# Patient Record
Sex: Female | Born: 1975 | Hispanic: Yes
Health system: Southern US, Community
[De-identification: ages and names within clinical notes are randomized; demographics above are authoritative.]

## PROBLEM LIST (undated history)

## (undated) HISTORY — PX: OTHER SURGICAL HISTORY: SHX169

---

## 2012-03-10 DIAGNOSIS — Z87410 Personal history of cervical dysplasia: Secondary | ICD-10-CM | POA: Insufficient documentation

## 2012-03-10 HISTORY — PX: OTHER SURGICAL HISTORY: SHX169

## 2012-10-06 ENCOUNTER — Emergency Department: Payer: Self-pay | Admitting: Emergency Medicine

## 2012-10-06 LAB — URINALYSIS, COMPLETE
Bilirubin,UR: NEGATIVE
Blood: NEGATIVE
Glucose,UR: NEGATIVE mg/dL (ref 0–75)
Ketone: NEGATIVE
Nitrite: NEGATIVE
Ph: 5 (ref 4.5–8.0)
WBC UR: 2 /HPF (ref 0–5)

## 2012-10-06 LAB — CBC
HCT: 37 % (ref 35.0–47.0)
MCH: 33.3 pg (ref 26.0–34.0)
MCHC: 35.2 g/dL (ref 32.0–36.0)
Platelet: 296 10*3/uL (ref 150–440)
WBC: 10.9 10*3/uL (ref 3.6–11.0)

## 2012-10-06 LAB — HCG, QUANTITATIVE, PREGNANCY: Beta Hcg, Quant.: 59322 m[IU]/mL — ABNORMAL HIGH

## 2012-12-28 ENCOUNTER — Observation Stay: Payer: Self-pay | Admitting: Obstetrics and Gynecology

## 2012-12-28 LAB — COMPREHENSIVE METABOLIC PANEL
Alkaline Phosphatase: 101 U/L (ref 50–136)
Anion Gap: 7 (ref 7–16)
Calcium, Total: 8.3 mg/dL — ABNORMAL LOW (ref 8.5–10.1)
Chloride: 107 mmol/L (ref 98–107)
Co2: 23 mmol/L (ref 21–32)
Creatinine: 0.36 mg/dL — ABNORMAL LOW (ref 0.60–1.30)
EGFR (African American): >60
Glucose: 109 mg/dL — ABNORMAL HIGH (ref 65–99)
Osmolality: 272 (ref 275–301)
Potassium: 3.4 mmol/L — ABNORMAL LOW (ref 3.5–5.1)
Sodium: 137 mmol/L (ref 136–145)
Total Protein: 6.2 g/dL — ABNORMAL LOW (ref 6.4–8.2)

## 2012-12-28 LAB — URINALYSIS, COMPLETE
Bacteria: NONE SEEN
Bilirubin,UR: NEGATIVE
Blood: NEGATIVE
Glucose,UR: NEGATIVE mg/dL (ref 0–75)
Nitrite: NEGATIVE
Ph: 7 (ref 4.5–8.0)
Protein: NEGATIVE
Specific Gravity: 1.008 (ref 1.003–1.030)
Squamous Epithelial: 4
WBC UR: 1 /HPF (ref 0–5)

## 2012-12-28 LAB — CBC WITH DIFFERENTIAL/PLATELET
Basophil %: 0.4 %
Eosinophil %: 1.7 %
HCT: 32.4 % — ABNORMAL LOW (ref 35.0–47.0)
HGB: 11.3 g/dL — ABNORMAL LOW (ref 12.0–16.0)
Lymphocyte #: 1.6 10*3/uL (ref 1.0–3.6)
Lymphocyte %: 13.4 %
MCH: 33.2 pg (ref 26.0–34.0)
MCHC: 34.9 g/dL (ref 32.0–36.0)
Monocyte #: 0.9 x10 3/mm (ref 0.2–0.9)
Neutrophil %: 76.9 %
WBC: 12.1 10*3/uL — ABNORMAL HIGH (ref 3.6–11.0)

## 2012-12-30 ENCOUNTER — Ambulatory Visit: Payer: Self-pay | Admitting: Obstetrics and Gynecology

## 2013-02-15 ENCOUNTER — Observation Stay: Payer: Self-pay | Admitting: Obstetrics and Gynecology

## 2013-03-11 ENCOUNTER — Observation Stay: Payer: Self-pay

## 2013-03-30 ENCOUNTER — Inpatient Hospital Stay: Payer: Self-pay | Admitting: Obstetrics and Gynecology

## 2013-03-30 LAB — CBC WITH DIFFERENTIAL/PLATELET
Basophil #: 0 10*3/uL (ref 0.0–0.1)
Basophil %: 0.4 %
EOS ABS: 0.1 10*3/uL (ref 0.0–0.7)
Eosinophil %: 1.4 %
HCT: 35.2 % (ref 35.0–47.0)
HGB: 12.3 g/dL (ref 12.0–16.0)
Lymphocyte #: 1.6 10*3/uL (ref 1.0–3.6)
Lymphocyte %: 15.8 %
MCH: 33.3 pg (ref 26.0–34.0)
MCHC: 34.9 g/dL (ref 32.0–36.0)
MCV: 95 fL (ref 80–100)
Monocyte #: 0.7 x10 3/mm (ref 0.2–0.9)
Monocyte %: 7.1 %
NEUTROS PCT: 75.3 %
Neutrophil #: 7.5 10*3/uL — ABNORMAL HIGH (ref 1.4–6.5)
Platelet: 280 10*3/uL (ref 150–440)
RBC: 3.7 10*6/uL — ABNORMAL LOW (ref 3.80–5.20)
RDW: 13.6 % (ref 11.5–14.5)
WBC: 9.9 10*3/uL (ref 3.6–11.0)

## 2013-03-31 LAB — HEMATOCRIT: HCT: 33.9 % — ABNORMAL LOW (ref 35.0–47.0)

## 2013-04-01 LAB — PATHOLOGY REPORT

## 2013-04-01 LAB — HEMOGLOBIN: HGB: 9.8 g/dL — ABNORMAL LOW (ref 12.0–16.0)

## 2014-06-17 NOTE — Op Note (Signed)
PATIENT NAME:  Kara Carroll, Kara Carroll MR#:  [id number] DATE OF BIRTH:  1975/03/16  DATE OF PROCEDURE:  03/31/2013  PREOPERATIVE DIAGNOSES:  1. Multipara. 2. Postpartum day 2. 3. Desires permanent sterilization. 4. Declines less permanent means.   POSTOPERATIVE DIAGNOSES:  1. Multipara. 2. Postpartum day 2. 3. Desires permanent sterilization. 4. Declines less permanent means.   PROCEDURE: Postpartum bilateral tubal ligation, modified [place].   SURGEON: Ricky L. Amalia Hailey, MD  ANESTHESIA: General endotracheal by Dr. Marcello Moores.   FINDINGS: Moderately obese postpartum abdomen, grossly normal uterine fundus and fallopian tubes.   ESTIMATED BLOOD LOSS: Minimal.   COMPLICATIONS: None.   SPECIMENS: Portion of bilateral fallopian tubes.   PROCEDURE IN DETAIL: The patient was consented and taken to the operating room and placed in supine position, where anesthesia was initiated and prepped and draped in the usual sterile fashion. Infraumbilical area was visualized and grasped at 9 and 3 o'clock with Allis clamps and injected with approximately 10 mL of 0.5% Sensorcaine. A #15 blade was used to make a transverse approximately 3 cm incision which was carried bluntly and sharply down to the peritoneal cavity. With the aid of Omni retractors and tilting the patient to the left, I was able to visualize the right fallopian tube. It was grasped and traced to fimbriated end to ensure identification and regrasped in a relatively avascular region of the ampullary portion and double ligated with 0 plain. A knuckle of tube was excised and sent for permanent pathology. Stumps were cauterized.  I proceeded in a similar fashion on the left side, rotating the patient to right.   Areas were inspected for hemostasis and seemed to be excellent. The fascia and deep tissue were closed with interrupted vertical mattress sutures of 0 Vicryl. Subcutaneous closure with 4-0 Vicryl. Steri-Strips and Band-Aids were  applied.   The patient tolerated the procedure well. All instrument, needle and sponge counts were correct. I anticipate a routine postoperative course.   ____________________________ Rockey Situ. Amalia Hailey, MD rle:lb D: 03/31/2013 11:03:55 ET T: 03/31/2013 11:31:50 ET JOB#: [id number]  cc: Ricky L. Amalia Hailey, MD, <Dictator> Selmer Dominion MD ELECTRONICALLY SIGNED 03/31/2013 12:29

## 2014-07-04 NOTE — H&P (Signed)
L&D Evaluation:  History:  HPI 39yo  [id number] with LMP of 04/24/12 and EDD of 04/02/13 here for IOL due to rapid labor pattern with [facility] significant for AMA, Late PNC at 19 weeks, LGSIL +HPV with plans for pp colposcopy, work up for SOB/Dyspnea, Neg ECHO and neg VQ scan, pt plans BTL. Dr. Cain Sieve aware of pt admission and agrees with AROM and Pitocin if needed. Pt aware IOL with Pitocin may causes maternal or fetal complications including tachysystole of uterus, fetal HR changes necessitating O2, repositioning, fluid boluses, stopping Pitocin. IOL has higher incidence of LTCS and pt accepts these risks.   Presents with IOL   Patient's Medical History AMA, LGSIL   Patient's Surgical History wisdom teeth   Medications Pre Natal Vitamins   Allergies NKDA   Social History none  married to [place]   Family History Non-Contributory   ROS:  ROS All systems were reviewed.  HEENT, CNS, GI, GU, Respiratory, CV, Renal and Musculoskeletal systems were found to be normal.   Exam:  Vital Signs stable   General no apparent distress   Mental Status clear   Chest clear   Heart normal sinus rhythm, no murmur/gallop/rubs   Abdomen gravid, non-tender   Estimated Fetal Weight Average for gestational age   Fetal Position vtx   Back no CVAT   Edema 1+   Pelvic 3/70/vtx-2   Mebranes Ruptured, ARM for clear fluid at [phone]   Description clear   FHT normal rate with no decels, reactive NSt with 2 accels 15 x 15 bpm   Ucx irregular   Skin dry   Lymph no lymphadenopathy   Impression:  Impression IUP at 39 4/7 weeks   Plan:  Plan monitor contractions and for cervical change   Electronic Signatures: Catheryn Bacon (CNM)  (Signed 04-Feb-15 09:28)  Authored: L&D Evaluation   Last Updated: 04-Feb-15 09:28 by Catheryn Bacon (CNM)

## 2014-11-26 IMAGING — CR DG CHEST 2V
1 series · 2 of 2 positions shown · non-contrast
Comparison: None.

CLINICAL DATA: Shortness of breath

EXAM:
CHEST  2 VIEW

[Series 1: pa · 0.17mm/px · 2 of 2 slices shown]
[im 1/2]
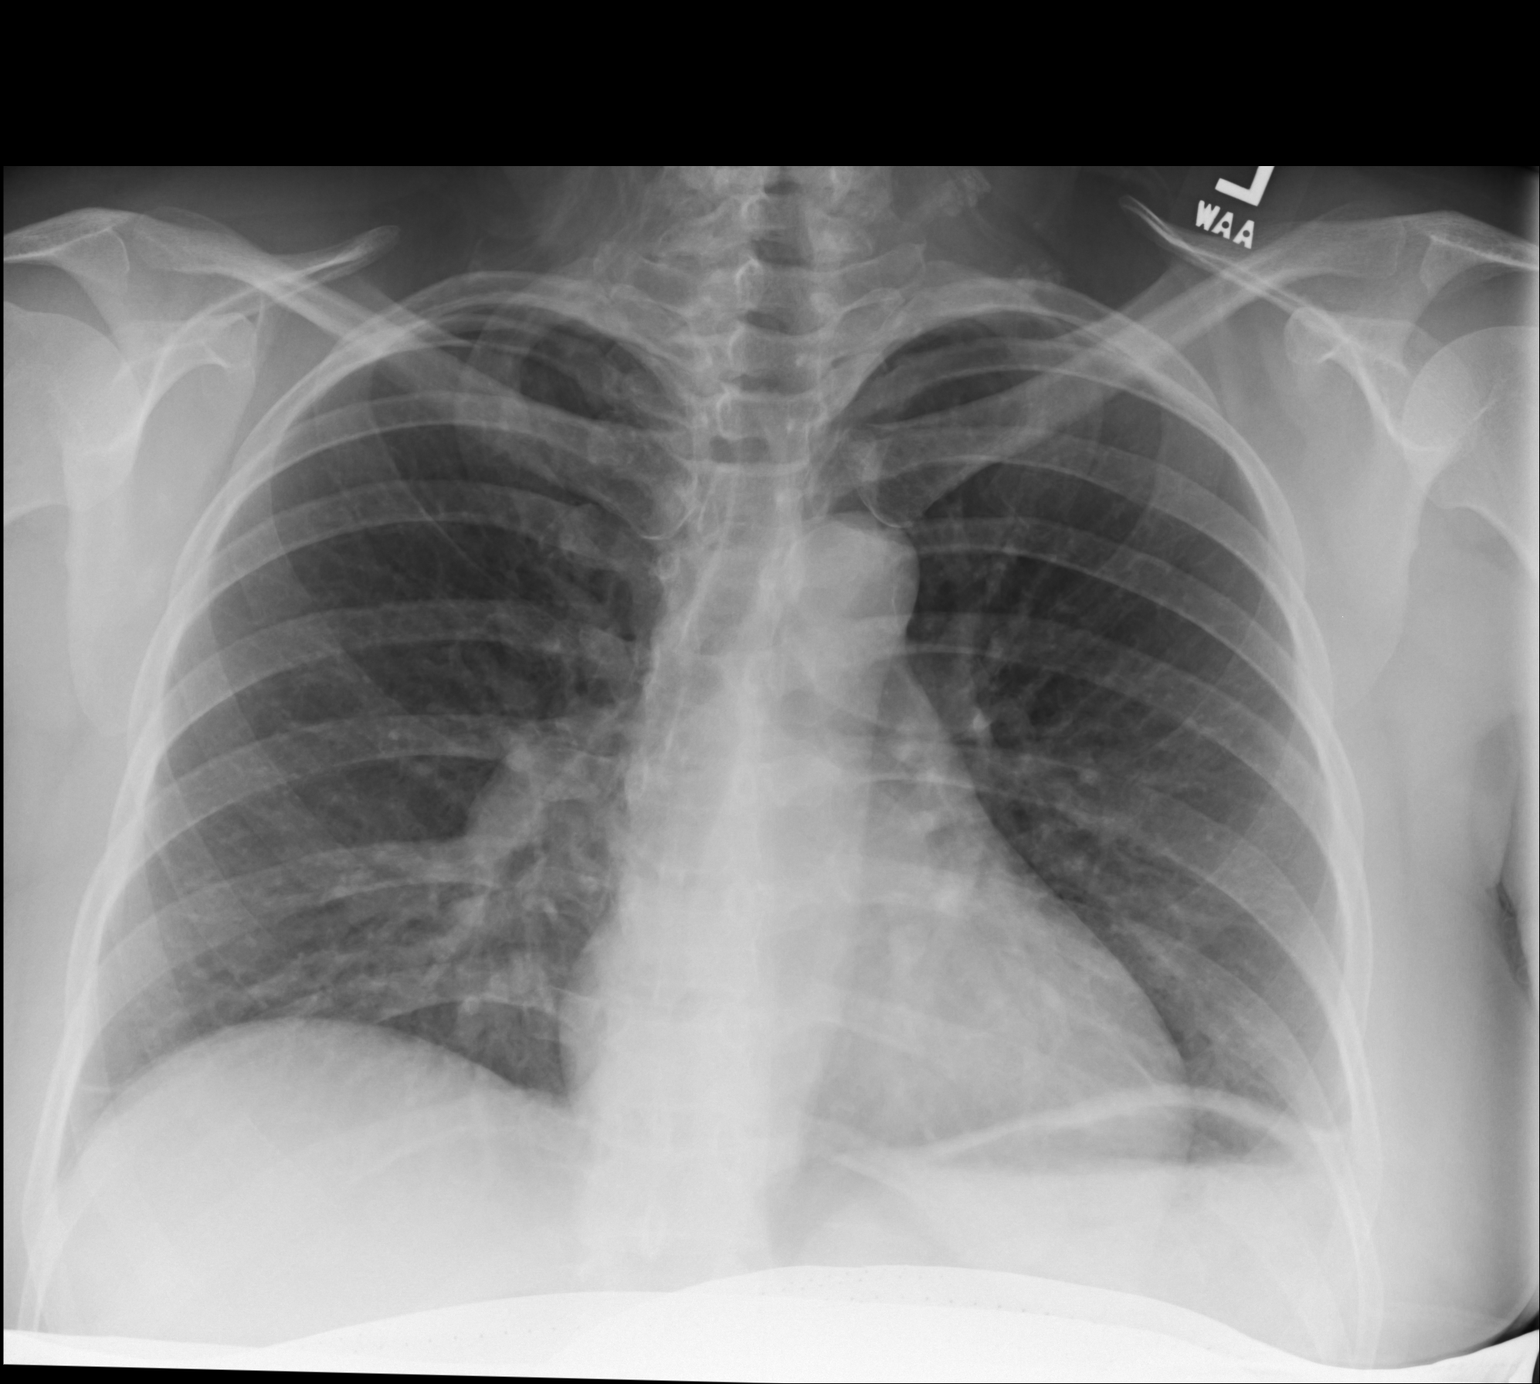
[im 2/2]
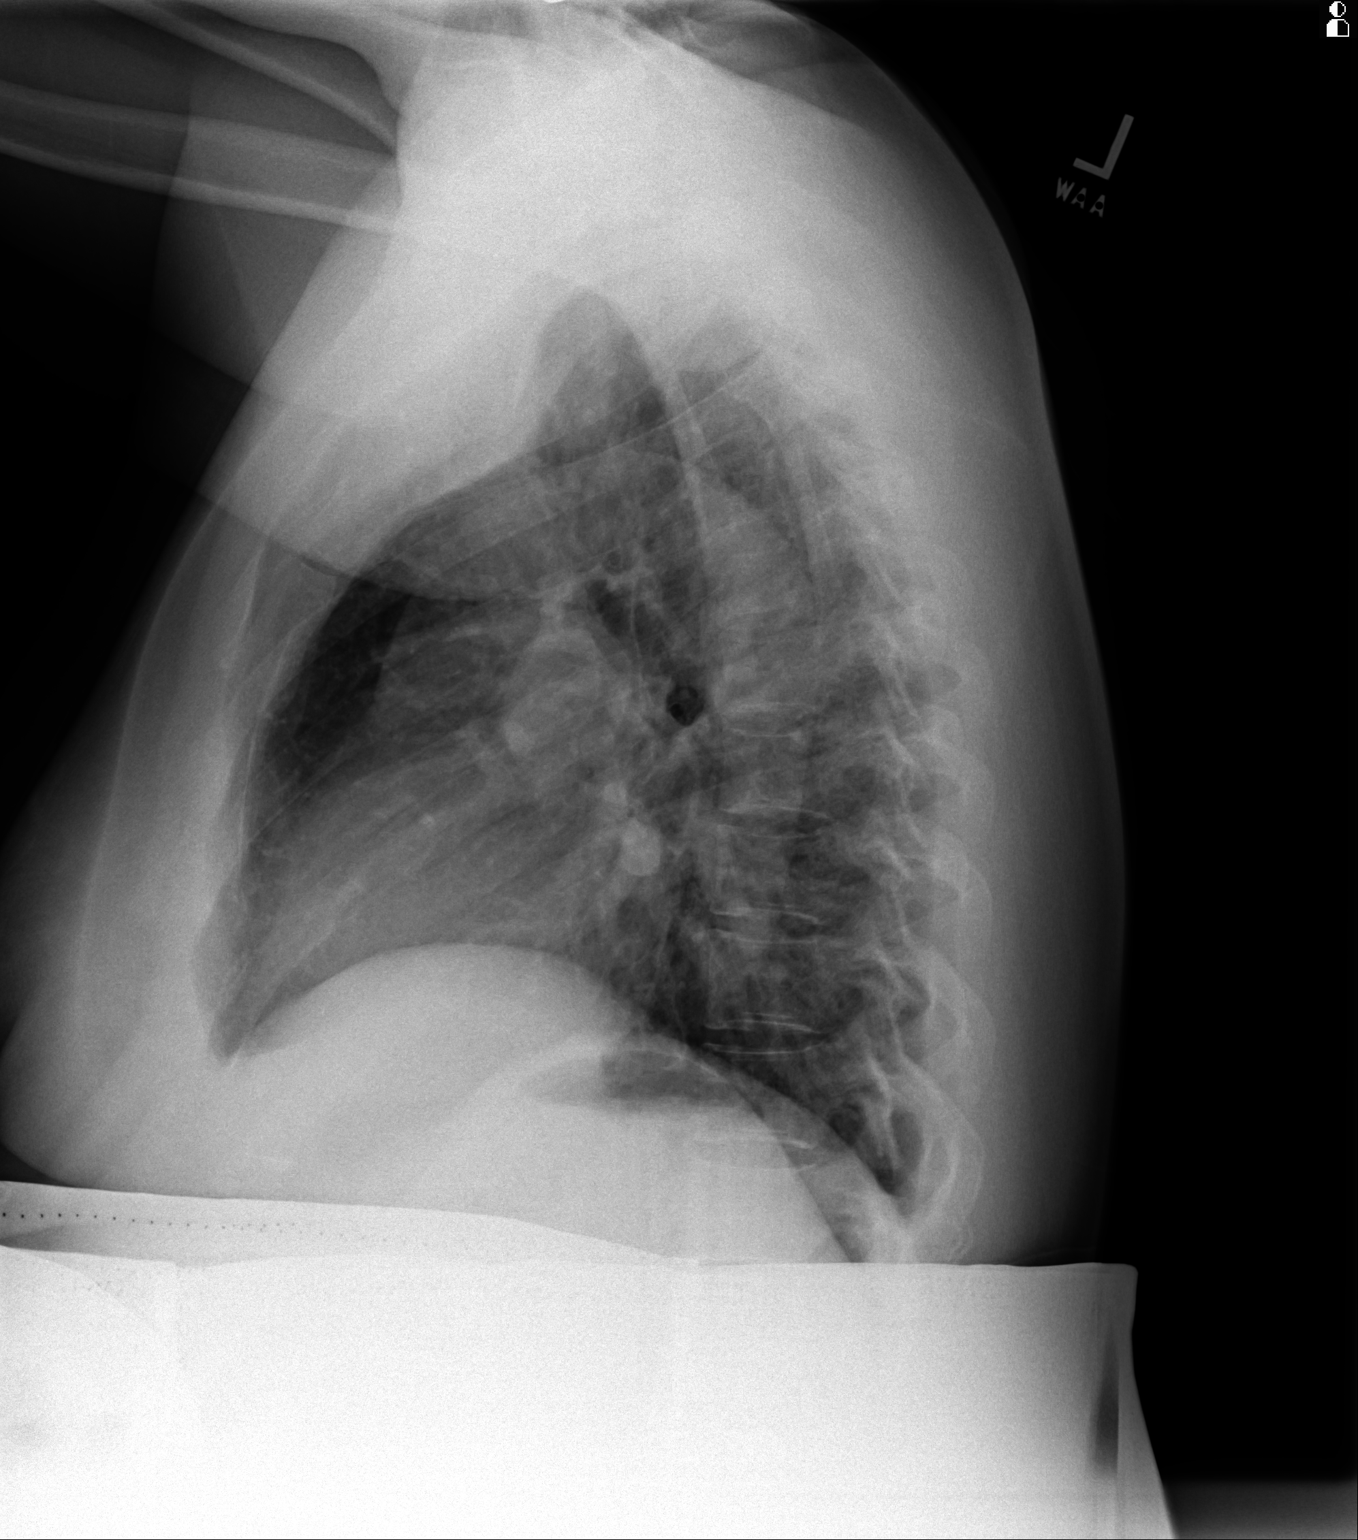

[2 of 2 positions shown; findings below may reference images not displayed]

FINDINGS: The cardiac shadow is within normal limits. The lungs are clear
bilaterally. No acute bony abnormality is noted. Patient is double
shielded due to the current pregnant state.
IMPRESSION: No active cardiopulmonary disease.

## 2016-02-04 ENCOUNTER — Other Ambulatory Visit: Payer: Self-pay | Admitting: Otolaryngology

## 2016-02-04 DIAGNOSIS — R42 Dizziness and giddiness: Secondary | ICD-10-CM

## 2016-02-15 ENCOUNTER — Encounter: Payer: Self-pay | Admitting: Radiology

## 2016-02-15 ENCOUNTER — Ambulatory Visit
Admission: RE | Admit: 2016-02-15 | Discharge: 2016-02-15 | Disposition: A | Discharge status: Home / Self Care | Source: Ambulatory Visit | Attending: Otolaryngology | Admitting: Otolaryngology

## 2016-02-15 DIAGNOSIS — R42 Dizziness and giddiness: Secondary | ICD-10-CM | POA: Insufficient documentation

## 2016-02-15 DIAGNOSIS — R9082 White matter disease, unspecified: Secondary | ICD-10-CM | POA: Insufficient documentation

## 2016-02-15 DIAGNOSIS — R9089 Other abnormal findings on diagnostic imaging of central nervous system: Secondary | ICD-10-CM | POA: Diagnosis not present

## 2016-02-15 MED ORDER — GADOBENATE DIMEGLUMINE 529 MG/ML IV SOLN
15.0000 mL | Freq: Once | INTRAVENOUS | Status: AC | PRN
  Administered 2016-02-15: 12 mL via INTRAVENOUS

## 2016-04-08 DIAGNOSIS — G43719 Chronic migraine without aura, intractable, without status migrainosus: Secondary | ICD-10-CM | POA: Insufficient documentation

## 2017-05-20 ENCOUNTER — Ambulatory Visit: Payer: Self-pay

## 2017-06-30 ENCOUNTER — Ambulatory Visit: Payer: Self-pay

## 2017-06-30 DIAGNOSIS — Z0000 Encounter for general adult medical examination without abnormal findings: Secondary | ICD-10-CM

## 2017-07-28 DIAGNOSIS — N3281 Overactive bladder: Secondary | ICD-10-CM | POA: Insufficient documentation

## 2017-07-28 DIAGNOSIS — G930 Cerebral cysts: Secondary | ICD-10-CM | POA: Insufficient documentation

## 2017-12-31 ENCOUNTER — Ambulatory Visit: Payer: Self-pay

## 2018-01-07 ENCOUNTER — Ambulatory Visit: Payer: Self-pay

## 2018-01-07 DIAGNOSIS — Z789 Other specified health status: Secondary | ICD-10-CM

## 2021-06-21 ENCOUNTER — Encounter: Payer: Self-pay | Admitting: Radiology

## 2021-08-06 ENCOUNTER — Encounter: Payer: Self-pay | Admitting: Radiology

## 2021-09-20 ENCOUNTER — Encounter: Payer: Medicaid Other | Admitting: Nurse Practitioner

## 2021-11-07 DIAGNOSIS — D219 Benign neoplasm of connective and other soft tissue, unspecified: Secondary | ICD-10-CM | POA: Insufficient documentation

## 2021-11-07 DIAGNOSIS — N3090 Cystitis, unspecified without hematuria: Secondary | ICD-10-CM | POA: Insufficient documentation

## 2022-02-03 ENCOUNTER — Ambulatory Visit: Attending: Physician Assistant | Admitting: Physical Therapy

## 2022-02-03 ENCOUNTER — Encounter: Payer: Self-pay | Admitting: Physical Therapy

## 2022-02-03 DIAGNOSIS — N3281 Overactive bladder: Secondary | ICD-10-CM | POA: Insufficient documentation

## 2022-02-03 DIAGNOSIS — Z87410 Personal history of cervical dysplasia: Secondary | ICD-10-CM | POA: Diagnosis present

## 2022-02-03 DIAGNOSIS — M533 Sacrococcygeal disorders, not elsewhere classified: Secondary | ICD-10-CM | POA: Diagnosis present

## 2022-02-03 DIAGNOSIS — R2689 Other abnormalities of gait and mobility: Secondary | ICD-10-CM | POA: Diagnosis present

## 2022-02-03 DIAGNOSIS — G930 Cerebral cysts: Secondary | ICD-10-CM | POA: Diagnosis present

## 2022-02-03 DIAGNOSIS — G43719 Chronic migraine without aura, intractable, without status migrainosus: Secondary | ICD-10-CM | POA: Diagnosis present

## 2022-02-03 DIAGNOSIS — N3090 Cystitis, unspecified without hematuria: Secondary | ICD-10-CM | POA: Diagnosis present

## 2022-02-03 DIAGNOSIS — D219 Benign neoplasm of connective and other soft tissue, unspecified: Secondary | ICD-10-CM | POA: Insufficient documentation

## 2022-02-03 NOTE — Patient Instructions (Addendum)
  No more sit ups  ___  Balance bladder irritants to water   Currently: 32 fl oz water, no juice, 8 fl oz of coffee, 32 fl oz sodas,   Increase: 40 fl oz water, decrease sodas to 16 fl oz, still enjoy 8 fl oz.  ( Handout - bladder irritant)   ___    Avoid straining pelvic floor, abdominal muscles , spine  Use log rolling technique instead of getting out of bed with your neck or the sit-up     Log rolling into and out of bed   Log rolling into and out of bed If getting out of bed on R side, Bent knees, scoot hips/ shoulder to L  Raise R arm completely overhead, rolling onto armpit  Then lower bent knees to bed to get into complete side lying position  Then drop legs off bed, and push up onto R elbow/forearm, and use L hand to push onto the bed    Dig elbows and feet to lift hte buttocks and scoot without lifting head   __   Sitting with feet on floor, hip width apart , not crossed   __   Lengthen Back rib by L  shoulder ( winging)    Lie on R  side , pillow between knees and under head  Pull  L arm overhead over mattress, grab the edge of mattress,pull it upward, drawing elbow away from ears  Breathing 10 reps  Brushing arm with 3/4 turn onto pillow behind back  Lying on R  side ,Pillow/ Block between knees     dragging top forearm across ribs below breast rotating 3/4 turn,  rotating  _L_ only this week ,  relax onto the pillow behind the back  and then back to other palm , maintain top palm on body whole top and not lift shoulder  ___

## 2022-02-03 NOTE — Therapy (Signed)
OUTPATIENT PHYSICAL THERAPY EVALUATION   Patient Name: Trinia Georgi MRN: [id number] DOB:[birth year], 46 y.o., female Today's Date: 02/03/2022   PT End of Session - 02/03/22 1105     Visit Number 1    Number of Visits 10    Date for PT Re-Evaluation 04/14/22    PT Start Time 1105    PT Stop Time 1145    PT Time Calculation (min) 40 min    Activity Tolerance Patient tolerated treatment well    Behavior During Therapy [facility] for tasks assessed/performed             History reviewed. No pertinent past medical history. Past Surgical History:  Procedure Laterality Date   cervical biopsy with loop electrode excision  03/10/2012   PR upper endoscopy, biopsy     09/21/2020   Patient Active Problem List   Diagnosis Date Noted   Fibroids 11/07/2021   Cystitis 11/07/2021   Arachnoid cyst 07/28/2017   Overactive bladder 07/28/2017   Intractable chronic migraine without aura and without status migrainosus 04/08/2016   History of cervical dysplasia 03/10/2012    PCP:   REFERRING PROVIDER: Burnett Harry , PA   REFERRING DIAG:    G89.29 (ICD-10-CM) - Other chronic pain  R35.0 (ICD-10-CM) - Frequency of micturition  N94.89 (ICD-10-CM) - Other specified conditions associated with female genital organs and menstrual cycle  M62.89 (ICD-10-CM) - Other specified disorders of muscle  N39.46 (ICD-10-CM) - Mixed incontinence  N94.10 (ICD-10-CM) - Unspecified dyspareunia    Rationale for Evaluation and Treatment Rehabilitation  THERAPY DIAG:  Sacrococcygeal disorders, not elsewhere classified  Other abnormalities of gait and mobility  Fibroids  Cystitis  Arachnoid cyst  Overactive bladder  Intractable chronic migraine without aura and without status migrainosus  History of cervical dysplasia  ONSET DATE: 2-3 years   SUBJECTIVE:                                                                                                                                                                                            SUBJECTIVE STATEMENT: 1) pelvic pain by pubic bone: "pressure" 8/10 all positions. Pain occurs at night. Pt push into the area with pillow to relief 4/10. Pt sleeps on her side. Pt also experiences this pressure feeling during intercourse. Pt is still on her menstrual cycle. Pt does 100 sit ups for the past 6 months. Hx of fibroids, 3 vaginal deliveries with excessive pushing. Neg saddle Sx, denied pushing with bowel movements, denied fall onto tailbone. Regular bowel movements without straining.    2) frequent urination : 8 time within 2 hours.  Daily fluids: 32 fl  oz water, no juice, 8 fl oz of coffee, 32 fl oz sodas,   3) LBP at her sacrum , especially with her period 4/10. Non-radiating pain   4) Headaches on R temple:  intensity 5/10 , occurs all the time, sleeps on one pillow.     PERTINENT HISTORY:  See medical section in chart   PAIN:  Are you having pain? Yes: see above   PRECAUTIONS: None  WEIGHT BEARING RESTRICTIONS: No  FALLS:  Has patient fallen in last 6 months? No  LIVING ENVIRONMENT: Lives with: lives with their daughter Lives in: Mobile home Stairs: No   OCCUPATION: no working   PLOF: Independent  PATIENT GOALS:  Stop pain at pubic area, stop going to bathroom to urinate often    OBJECTIVE:    [facility] PT Assessment - 02/03/22 1136       Strength   Overall Strength Comments R hip flex, knee flex/ ext 3+/5, L 4+/5,  hip abd L 4+/5, R 3-/5 ( post Tx: R hip abd 3+/5, R hip flex, knee flex/ext 4+/5      Palpation   Spinal mobility B sideflexion reproduces sacral pain, ( post Tx: no sacral pain with B sideflexion)    SI assessment  R iliac crest higher, R shoulder higher , supine: L iliac crest/ medial malleoli higher, ( post Tx: levelled pelvis/shoulder)    Palpation comment thoracic hypomobility, L medial/ lower mm  Tendnerness pain at pubic symphysis  ( Post Tx: decreased tenderness)        Bed Mobility    Bed Mobility --   crunch method     Ambulation/Gait   Gait Comments less anterior pelvic rotation of pelvic girdle , 1.29 m/s,             Pelvic Floor Special Questions - 02/03/22 1142     Diastasis Recti 1 fingers width             [facility] Adult PT Treatment/Exercise - 02/03/22 1136       Therapeutic Activites    Other Therapeutic Activities explained POC, customized care , goal setting      Neuro Re-ed    Neuro Re-ed Details  cued for log rolling, sitting posture,and stretches      Manual Therapy   Manual therapy comments STM/MWM at thoracic area L to promote levelled shoulders/ pelvis                HOME EXERCISE PROGRAM: See pt instruction section    ASSESSMENT:  CLINICAL IMPRESSION:   Pt is a  46   yo  who presents with pelvic pain, pressure sensation, frequent urination, and non-radiating LBP  which impact QOL, ADL, fitness, and community activities.   Pt's musculoskeletal assessment revealed uneven pelvic girdle and shoulder height, asymmetries to gait pattern, limited spinal /pelvic mobility, dyscoordination and strength of pelvic floor mm, hip weakness, poor body mechanics which places strain on the abdominal/pelvic floor mm. These are deficits that indicate an ineffective intraabdominal pressure system associated with increased risk for pt's Sx.   Pt will benefit from proper coordination training and education on fitness and functional positions in order to yield greater outcomes as pt performed sit-ups/ crunches in the past.  Advised pt to not perform sit-ups and crunches as these movement patterns lead to more downward forces on the pelvic floor, negatively impacting abdominopelvic/spinal dysfunctions.   Pt will benefit from coordination training and education on fitness and functional positions in order to gain a  more effective intraabdominal pressure system to minimize urinary leakage.  Pt was provided education on etiology of Sx with anatomy,  physiology explanation with images along with the benefits of customized pelvic PT Tx based on pt's medical conditions and musculoskeletal deficits.  Explained the physiology of deep core mm coordination and roles of pelvic floor function in urination, defecation, sexual function, and postural control with deep core mm system.   Regional interdependent approaches will yield greater benefits in pt's POC due to the complexity of pt's medical Hx and the significant impact their Sx have had on their QOL. Pt would benefit from a biopsychosocial approach to yield optimal outcomes. Plan to build interdisciplinary team with pt's providers to optimize patient-centered care.    Following Tx today which pt tolerated without complaints, pt demo'd equal alignment of pelvic girdle and increased spinal mobility. Pt reported decreased pelvic pain from 8/10 to 7/10. Pt demo'd no LBP with B sideflexion post Tx. Plan to administer FOTO at next session for pelvic floor and to initiate deep core coordination   next session.    Pt benefits from skilled PT.    OBJECTIVE IMPAIRMENTS decreased activity tolerance, decreased coordination, decreased endurance, decreased mobility, difficulty walking, decreased ROM, decreased strength, decreased safety awareness, hypomobility, increased muscle spasms, impaired flexibility, improper body mechanics, postural dysfunction, and pain. scar restrictions   ACTIVITY LIMITATIONS  self-care,  sleep, home chores, work tasks    PARTICIPATION LIMITATIONS:  community, gym activities    [place]   Hx of sit up/ crunches    are also affecting patient's functional outcome.    REHAB POTENTIAL: Good   CLINICAL DECISION MAKING: Evolving/moderate complexity   EVALUATION COMPLEXITY: Moderate    PATIENT EDUCATION:    Education details: Showed pt anatomy images. Explained muscles attachments/ connection, physiology of deep core system/ spinal- thoracic-pelvis-lower kinetic chain as  they relate to pt's presentation, Sx, and past Hx. Explained what and how these areas of deficits need to be restored to balance and function    See Therapeutic activity / neuromuscular re-education section  Answered pt's questions.   Person educated: Patient Education method: Explanation, Demonstration, Tactile cues, Verbal cues, and Handouts Education comprehension: verbalized understanding, returned demonstration, verbal cues required, tactile cues required, and needs further education     PLAN: PT FREQUENCY: 1x/week   PT DURATION: 10 weeks   PLANNED INTERVENTIONS: Therapeutic exercises, Therapeutic activity, Neuromuscular re-education, Balance training, Gait training, Patient/Family education, Self Care, Joint mobilization, Spinal mobilization, Moist heat, Taping, and Manual therapy, dry needling.   PLAN FOR NEXT SESSION: See clinical impression for plan     GOALS: Goals reviewed with patient? Yes  SHORT TERM GOALS: Target date: 03/03/2022    Pt will demo IND with HEP                    Baseline: Not IND            Goal status: INITIAL   LONG TERM GOALS: Target date: 04/14/2022    1.Pt will demo proper deep core coordination without chest breathing and optimal excursion of diaphragm/pelvic floor in order to promote spinal stability and pelvic floor function  Baseline: dyscoordination Goal status: INITIAL  2.  Pt will demo > 5 pt change on FOTO  to improve QOL and function  (Plan to administer FOTO next session)  Pelvic Pain baseline - PFDI Urinary baseline - Bowel  constipation baseline - Bowel Leakage baseline - Urinary Problem baseline- PFDI Bowel -  Lumber  baseline  - 79 pts  Goal status: INITIAL  3.  Pt will demo proper body mechanics in against gravity tasks and ADLs  work tasks, fitness  to minimize straining pelvic floor / back                  Baseline: not IND, improper form that places strain on pelvic floor                Goal status:  INITIAL    4. Pt will demo increased gait speed > 1.5 m/s in order to ambulate safely in community and return to fitness routine  Baseline: less anterior pelvic rotation of pelvic girdle , 1.29 m/s  Goal status: INITIAL    5. Pt will demo levelled pelvic girdle and shoulder height in order to progress to deep core strengthening HEP and restore mobility at spine, pelvis, gait, posture   Baseline: R iliac crest, L shoulder higher  Goal status: INITIAL   6. Pt will report no tenderness at pubic symphysis across 2 weeks and demo anterior tilt of pelvis without cues  to return to walking and improve QOL  Baseline: tednerness at pubic symphysis  Goal status: INITIAL    Jerl Mina, PT 02/03/2022, 11:17 AM

## 2022-02-10 ENCOUNTER — Ambulatory Visit: Admitting: Physical Therapy

## 2022-02-10 DIAGNOSIS — M533 Sacrococcygeal disorders, not elsewhere classified: Secondary | ICD-10-CM

## 2022-02-10 DIAGNOSIS — R2689 Other abnormalities of gait and mobility: Secondary | ICD-10-CM

## 2022-02-10 NOTE — Patient Instructions (Addendum)
   Deep core level 1 ( handout)  x 2 x day   ___________________    BOTH SIDES FOR :  Lengthen Back rib by R  shoulder   ( winging)    Lie on L  side , pillow between knees and under head  Pull  R arm overhead over mattress, grab the edge of mattress,pull it upward, drawing elbow away from ears  Breathing 10 reps  Brushing arm with 3/4 turn onto pillow behind back  Lying on L  side ,Pillow/ Block between knees     dragging top forearm across ribs below breast rotating 3/4 turn,  rotating  _R_ only this week ,  relax onto the pillow behind the back  and then back to other palm , maintain top palm on body whole top and not lift shoulder   ____________________  Neck / shoulder stretches: _wall stretch Clock stretch with head turns :  stand perpendicular to the wall, L side to the wall Tilt head to wall  Place L palm at 7 o clock Chin tuck like you are looking into armpit Look at "[place] on giant map  Swivel head with chin tucked to look in upper corner of ceiling as if you are look at  [place] on giant map "   5 reps   Switch direction, R palm on wall at 5 o 'clock   Chin tuck like you are looking into armpit Look at "[place]  on giant map  Swivel head with chin tucked to look in upper corner of ceiling as if you are look at  [facility] on giant map "   5 reps    ___

## 2022-02-10 NOTE — Therapy (Signed)
OUTPATIENT PHYSICAL THERAPY Treatment    Patient Name: Kara Carroll MRN: [id number] DOB:[birth year], 46 y.o., female Today's Date: 02/11/2022   PT End of Session - 02/10/22 1026     Visit Number 2    Number of Visits 10    Date for PT Re-Evaluation 04/14/22   eval 02/03/22   PT Start Time 1021    PT Stop Time 1114    PT Time Calculation (min) 53 min    Activity Tolerance Patient tolerated treatment well    Behavior During Therapy [facility] for tasks assessed/performed             No past medical history on file. Past Surgical History:  Procedure Laterality Date   cervical biopsy with loop electrode excision  03/10/2012   PR upper endoscopy, biopsy     09/21/2020   Patient Active Problem List   Diagnosis Date Noted   Fibroids 11/07/2021   Cystitis 11/07/2021   Arachnoid cyst 07/28/2017   Overactive bladder 07/28/2017   Intractable chronic migraine without aura and without status migrainosus 04/08/2016   History of cervical dysplasia 03/10/2012    PCP:   REFERRING PROVIDER: Burnett Harry , PA   REFERRING DIAG:    G89.29 (ICD-10-CM) - Other chronic pain  R35.0 (ICD-10-CM) - Frequency of micturition  N94.89 (ICD-10-CM) - Other specified conditions associated with female genital organs and menstrual cycle  M62.89 (ICD-10-CM) - Other specified disorders of muscle  N39.46 (ICD-10-CM) - Mixed incontinence  N94.10 (ICD-10-CM) - Unspecified dyspareunia    Rationale for Evaluation and Treatment Rehabilitation  THERAPY DIAG:  Sacrococcygeal disorders, not elsewhere classified  Other abnormalities of gait and mobility  ONSET DATE: 2-3 years   SUBJECTIVE:         SUBJECTIVE STATEMENT today  Pt increased water intake from 32 fl oz to 60 fl oz and decreased sodas from 32 fl oz sodas to 24 fl oz sodas. Pt did not do sit ups all last week.                                                                                                                                                                                       SUBJECTIVE STATEMENT on eval : 1) pelvic pain by pubic bone: "pressure" 8/10 all positions. Pain occurs at night. Pt push into the area with pillow to relief 4/10. Pt sleeps on her side. Pt also experiences this pressure feeling during intercourse. Pt is still on her menstrual cycle. Pt does 100 sit ups for the past 6 months. Hx of fibroids, 3 vaginal deliveries with excessive pushing. Neg saddle Sx, denied pushing with bowel movements, denied fall onto tailbone.  Regular bowel movements without straining.    2) frequent urination : 8 time within 2 hours.  Daily fluids: 32 fl oz water, no juice, 8 fl oz of coffee, 32 fl oz sodas,   3) LBP at her sacrum , especially with her period 4/10. Non-radiating pain   4) Headaches on R temple:  intensity 5/10 , occurs all the time, sleeps on one pillow.     PERTINENT HISTORY:  See medical section in chart   PAIN:  Are you having pain? Yes: see above   PRECAUTIONS: None  WEIGHT BEARING RESTRICTIONS: No  FALLS:  Has patient fallen in last 6 months? No  LIVING ENVIRONMENT: Lives with: lives with their daughter Lives in: Mobile home Stairs: No   OCCUPATION: no working   PLOF: Independent  PATIENT GOALS:  Stop pain at pubic area, stop going to bathroom to urinate often    OBJECTIVE:    [facility] PT Assessment - 02/10/22 1527       Observation/Other Assessments   Observations slight anterior rotation of thorax to R      Strength   Overall Strength Comments B hip flex/ knee flex/ ext 4/5      Palpation   Spinal mobility R sideflexion with 3/10 at R PSIS    SI assessment  levelled pelvic girdle    Palpation comment tenderness at T12 R deviated segments               [facility] Adult PT Treatment/Exercise - 02/10/22 1529       Neuro Re-ed    Neuro Re-ed Details  excessive cues for deep core coordination      Manual Therapy   Manual therapy comments STM/MWM at R  thoracic  area L to promote levelled shoulders                 HOME EXERCISE PROGRAM: See pt instruction section    ASSESSMENT:  CLINICAL IMPRESSION:   Pt demo'd equal alignment of pelvic girdle which is good carry over from last session.   Pt demo'd no LBP with B sideflexion post Tx.   Focused on decreasing R paraspinal mm tightness to level out thoracic spine.  Initiated deep core level 1 with excessive cues for less ab overuse. Plan to progress to deep core level 2 at next session.     Pt benefits from skilled PT.    OBJECTIVE IMPAIRMENTS decreased activity tolerance, decreased coordination, decreased endurance, decreased mobility, difficulty walking, decreased ROM, decreased strength, decreased safety awareness, hypomobility, increased muscle spasms, impaired flexibility, improper body mechanics, postural dysfunction, and pain. scar restrictions   ACTIVITY LIMITATIONS  self-care,  sleep, home chores, work tasks    PARTICIPATION LIMITATIONS:  community, gym activities    [place]   Hx of sit up/ crunches    are also affecting patient's functional outcome.    REHAB POTENTIAL: Good   CLINICAL DECISION MAKING: Evolving/moderate complexity   EVALUATION COMPLEXITY: Moderate    PATIENT EDUCATION:    Education details: Showed pt anatomy images. Explained muscles attachments/ connection, physiology of deep core system/ spinal- thoracic-pelvis-lower kinetic chain as they relate to pt's presentation, Sx, and past Hx. Explained what and how these areas of deficits need to be restored to balance and function    See Therapeutic activity / neuromuscular re-education section  Answered pt's questions.   Person educated: Patient Education method: Explanation, Demonstration, Tactile cues, Verbal cues, and Handouts Education comprehension: verbalized understanding, returned demonstration, verbal cues required, tactile cues  required, and needs further education     PLAN: PT  FREQUENCY: 1x/week   PT DURATION: 10 weeks   PLANNED INTERVENTIONS: Therapeutic exercises, Therapeutic activity, Neuromuscular re-education, Balance training, Gait training, Patient/Family education, Self Care, Joint mobilization, Spinal mobilization, Moist heat, Taping, and Manual therapy, dry needling.   PLAN FOR NEXT SESSION: See clinical impression for plan     GOALS: Goals reviewed with patient? Yes  SHORT TERM GOALS: Target date: 03/03/2022    Pt will demo IND with HEP                    Baseline: Not IND            Goal status: INITIAL   LONG TERM GOALS: Target date: 04/14/2022    1.Pt will demo proper deep core coordination without chest breathing and optimal excursion of diaphragm/pelvic floor in order to promote spinal stability and pelvic floor function  Baseline: dyscoordination Goal status: INITIAL  2.  Pt will demo > 5 pt change on FOTO  to improve QOL and function   Pelvic Pain baseline -  46  PFDI Urinary baseline - 63   Urinary Problem baseline- 61   Lumber baseline  - 79 pts  Goal status: INITIAL  3.  Pt will demo proper body mechanics in against gravity tasks and ADLs  work tasks, fitness  to minimize straining pelvic floor / back                  Baseline: not IND, improper form that places strain on pelvic floor                Goal status: INITIAL    4. Pt will demo increased gait speed > 1.5 m/s in order to ambulate safely in community and return to fitness routine  Baseline: less anterior pelvic rotation of pelvic girdle , 1.29 m/s  Goal status: INITIAL    5. Pt will demo levelled pelvic girdle and shoulder height in order to progress to deep core strengthening HEP and restore mobility at spine, pelvis, gait, posture   Baseline: R iliac crest, L shoulder higher  Goal status: INITIAL   6. Pt will report no tenderness at pubic symphysis across 2 weeks and demo anterior tilt of pelvis without cues  to return to walking and improve QOL   Baseline: tednerness at pubic symphysis  Goal status: INITIAL    Jerl Mina, PT 02/11/2022, 3:30 PM

## 2022-02-18 ENCOUNTER — Ambulatory Visit: Admitting: Physical Therapy

## 2022-02-25 ENCOUNTER — Ambulatory Visit: Attending: Physician Assistant | Admitting: Physical Therapy

## 2022-02-25 DIAGNOSIS — R2689 Other abnormalities of gait and mobility: Secondary | ICD-10-CM | POA: Insufficient documentation

## 2022-02-25 DIAGNOSIS — M533 Sacrococcygeal disorders, not elsewhere classified: Secondary | ICD-10-CM | POA: Diagnosis not present

## 2022-02-25 NOTE — Patient Instructions (Signed)
Both sides for the winging / brushing   __  Side of hip stretch:  both sides   Reclined twist for hips and side of the hips/ legs  Lay on your back, knees bend Scoot hips to the R , leave shoulders in place Wobble knees to the L side 45 deg and to midline  10 reps     __  Handout Deep core level 1-2

## 2022-02-25 NOTE — Therapy (Signed)
OUTPATIENT PHYSICAL THERAPY Treatment    Patient Name: Kara Carroll MRN: [id number] DOB:[birth year], 47 y.o., female Today's Date: 02/25/2022   PT End of Session - 02/25/22 1025     Visit Number 3    Number of Visits 10    Date for PT Re-Evaluation 04/14/22   eval 02/03/22   PT Start Time 4098    PT Stop Time 1100    PT Time Calculation (min) 45 min    Activity Tolerance Patient tolerated treatment well    Behavior During Therapy [facility] for tasks assessed/performed             No past medical history on file. Past Surgical History:  Procedure Laterality Date   cervical biopsy with loop electrode excision  03/10/2012   PR upper endoscopy, biopsy     09/21/2020   Patient Active Problem List   Diagnosis Date Noted   Fibroids 11/07/2021   Cystitis 11/07/2021   Arachnoid cyst 07/28/2017   Overactive bladder 07/28/2017   Intractable chronic migraine without aura and without status migrainosus 04/08/2016   History of cervical dysplasia 03/10/2012    PCP:   REFERRING PROVIDER: Burnett Harry , PA   REFERRING DIAG:    G89.29 (ICD-10-CM) - Other chronic pain  R35.0 (ICD-10-CM) - Frequency of micturition  N94.89 (ICD-10-CM) - Other specified conditions associated with female genital organs and menstrual cycle  M62.89 (ICD-10-CM) - Other specified disorders of muscle  N39.46 (ICD-10-CM) - Mixed incontinence  N94.10 (ICD-10-CM) - Unspecified dyspareunia    Rationale for Evaluation and Treatment Rehabilitation  THERAPY DIAG:  Sacrococcygeal disorders, not elsewhere classified  Other abnormalities of gait and mobility  ONSET DATE: 2-3 years   SUBJECTIVE:         SUBJECTIVE STATEMENT today  Pt increased water intake and stopped sodas all together which decreased her frequency 8 time within 2 hours to 4 x per 2 hours.    LBP is better by 20%. Pubic pain is the same, no change                                                                                                                                                                                      SUBJECTIVE STATEMENT on eval : 1) pelvic pain by pubic bone: "pressure" 8/10 all positions. Pain occurs at night. Pt push into the area with pillow to relief 4/10. Pt sleeps on her side. Pt also experiences this pressure feeling during intercourse. Pt is still on her menstrual cycle. Pt does 100 sit ups for the past 6 months. Hx of fibroids, 3 vaginal deliveries with excessive pushing. Neg saddle Sx, denied pushing with bowel movements,  denied fall onto tailbone. Regular bowel movements without straining.    2) frequent urination : 8 time within 2 hours.  Daily fluids: 32 fl oz water, no juice, 8 fl oz of coffee, 32 fl oz sodas,   3) LBP at her sacrum , especially with her period 4/10. Non-radiating pain   4) Headaches on R temple:  intensity 5/10 , occurs all the time, sleeps on one pillow.     PERTINENT HISTORY:  See medical section in chart   PAIN:  Are you having pain? Yes: see above   PRECAUTIONS: None  WEIGHT BEARING RESTRICTIONS: No  FALLS:  Has patient fallen in last 6 months? No  LIVING ENVIRONMENT: Lives with: lives with their daughter Lives in: Mobile home Stairs: No   OCCUPATION: no working   PLOF: Independent  PATIENT GOALS:  Stop pain at pubic area, stop going to bathroom to urinate often    OBJECTIVE:   [facility] PT Assessment - 02/25/22 1027       Palpation   SI assessment  L iliac crest L shoulder higher , L lumbar convex curve             [facility] Adult PT Treatment/Exercise - 02/25/22 1237       Therapeutic Activites    Other Therapeutic Activities explained deep core level 1-2 function, explained pelvic tilts for decreasing pubic pain      Neuro Re-ed    Neuro Re-ed Details  excessive cues for pelvic tilt  and deep core level 1-2      Manual Therapy   Manual therapy comments STM/MWM at L  thoracic area  to promote levelled shoulders/ pelvis                 HOME EXERCISE PROGRAM: See pt instruction section    ASSESSMENT:  CLINICAL IMPRESSION:   LBP has resolved. Pelvic alignment was not levelled today. Addressed L lumbar area with manual Tx which realigned pelvis and instructed pt to perform bilateral thoracic HEP, not just on R side.   Progressed to deep core level 1-2 with cues for technique. Required excessive cues for pelvic tilts. Pt reported decreased pubic pain with anterior tilts.    Plan to review deep core and pelvic tilt coordination at next session  Pt benefits from skilled PT.    OBJECTIVE IMPAIRMENTS decreased activity tolerance, decreased coordination, decreased endurance, decreased mobility, difficulty walking, decreased ROM, decreased strength, decreased safety awareness, hypomobility, increased muscle spasms, impaired flexibility, improper body mechanics, postural dysfunction, and pain. scar restrictions   ACTIVITY LIMITATIONS  self-care,  sleep, home chores, work tasks    PARTICIPATION LIMITATIONS:  community, gym activities    [place]   Hx of sit up/ crunches    are also affecting patient's functional outcome.    REHAB POTENTIAL: Good   CLINICAL DECISION MAKING: Evolving/moderate complexity   EVALUATION COMPLEXITY: Moderate    PATIENT EDUCATION:    Education details: Showed pt anatomy images. Explained muscles attachments/ connection, physiology of deep core system/ spinal- thoracic-pelvis-lower kinetic chain as they relate to pt's presentation, Sx, and past Hx. Explained what and how these areas of deficits need to be restored to balance and function    See Therapeutic activity / neuromuscular re-education section  Answered pt's questions.   Person educated: Patient Education method: Explanation, Demonstration, Tactile cues, Verbal cues, and Handouts Education comprehension: verbalized understanding, returned demonstration, verbal cues required, tactile cues required, and needs  further education     PLAN: PT  FREQUENCY: 1x/week   PT DURATION: 10 weeks   PLANNED INTERVENTIONS: Therapeutic exercises, Therapeutic activity, Neuromuscular re-education, Balance training, Gait training, Patient/Family education, Self Care, Joint mobilization, Spinal mobilization, Moist heat, Taping, and Manual therapy, dry needling.   PLAN FOR NEXT SESSION: See clinical impression for plan     GOALS: Goals reviewed with patient? Yes  SHORT TERM GOALS: Target date: 03/03/2022    Pt will demo IND with HEP                    Baseline: Not IND            Goal status: INITIAL   LONG TERM GOALS: Target date: 04/14/2022    1.Pt will demo proper deep core coordination without chest breathing and optimal excursion of diaphragm/pelvic floor in order to promote spinal stability and pelvic floor function  Baseline: dyscoordination Goal status: INITIAL  2.  Pt will demo > 5 pt change on FOTO  to improve QOL and function   Pelvic Pain baseline -  46  PFDI Urinary baseline - 63   Urinary Problem baseline- 61   Lumber baseline  - 79 pts  Goal status: INITIAL  3.  Pt will demo proper body mechanics in against gravity tasks and ADLs  work tasks, fitness  to minimize straining pelvic floor / back                  Baseline: not IND, improper form that places strain on pelvic floor                Goal status: INITIAL    4. Pt will demo increased gait speed > 1.5 m/s in order to ambulate safely in community and return to fitness routine  Baseline: less anterior pelvic rotation of pelvic girdle , 1.29 m/s  Goal status: INITIAL    5. Pt will demo levelled pelvic girdle and shoulder height in order to progress to deep core strengthening HEP and restore mobility at spine, pelvis, gait, posture   Baseline: R iliac crest, L shoulder higher  Goal status: INITIAL   6. Pt will report no tenderness at pubic symphysis across 2 weeks and demo anterior tilt of pelvis without cues  to  return to walking and improve QOL  Baseline: tednerness at pubic symphysis  Goal status: INITIAL    Jerl Mina, PT 02/25/2022, 10:26 AM

## 2022-03-04 ENCOUNTER — Ambulatory Visit: Admitting: Physical Therapy

## 2022-03-11 ENCOUNTER — Encounter: Admitting: Physical Therapy

## 2022-03-18 ENCOUNTER — Encounter: Admitting: Physical Therapy
